# Patient Record
Sex: Male | Born: 1980 | Race: Black or African American | Hispanic: No | Marital: Married | Smoking: Never smoker
Health system: Southern US, Community
[De-identification: ages and names within clinical notes are randomized; demographics above are authoritative.]

## PROBLEM LIST (undated history)

## (undated) HISTORY — PX: KNEE ARTHROSCOPY: SHX127

---

## 2017-05-25 ENCOUNTER — Emergency Department (HOSPITAL_BASED_OUTPATIENT_CLINIC_OR_DEPARTMENT_OTHER)
Admission: EM | Admit: 2017-05-25 | Discharge: 2017-05-25 | Disposition: A | Discharge status: Home / Self Care | Payer: Self-pay | Attending: Emergency Medicine | Admitting: Emergency Medicine

## 2017-05-25 ENCOUNTER — Emergency Department (HOSPITAL_BASED_OUTPATIENT_CLINIC_OR_DEPARTMENT_OTHER): Payer: Self-pay

## 2017-05-25 ENCOUNTER — Encounter (HOSPITAL_BASED_OUTPATIENT_CLINIC_OR_DEPARTMENT_OTHER): Payer: Self-pay | Admitting: *Deleted

## 2017-05-25 DIAGNOSIS — Y9289 Other specified places as the place of occurrence of the external cause: Secondary | ICD-10-CM | POA: Insufficient documentation

## 2017-05-25 DIAGNOSIS — S61112A Laceration without foreign body of left thumb with damage to nail, initial encounter: Secondary | ICD-10-CM | POA: Insufficient documentation

## 2017-05-25 DIAGNOSIS — W260XXA Contact with knife, initial encounter: Secondary | ICD-10-CM | POA: Insufficient documentation

## 2017-05-25 DIAGNOSIS — Y999 Unspecified external cause status: Secondary | ICD-10-CM | POA: Insufficient documentation

## 2017-05-25 DIAGNOSIS — Y9389 Activity, other specified: Secondary | ICD-10-CM | POA: Insufficient documentation

## 2017-05-25 NOTE — ED Notes (Signed)
Patient transported to X-ray 

## 2017-05-25 NOTE — ED Provider Notes (Signed)
[facility]   CSN: [id number] Arrival date & time: 05/25/17  2005     History   Chief Complaint Chief Complaint  Patient presents with  . Laceration    HPI Javier Anderson is a 36 y.o. male.  HPI  This is a 36 year old male who presents with a laceration to the left thumb. Patient reports that he was cutting that stools when he cut the tip of his left thumb off. Unknown last tetanus shot. Pain is throbbing and worse when wound is manipulated. Denies other injury. Current pain is 4 out of 10.  History reviewed. No pertinent past medical history.  There are no active problems to display for this patient.   Past Surgical History:  Procedure Laterality Date  . KNEE ARTHROSCOPY         Home Medications    Prior to Admission medications   Not on File    Family History History reviewed. No pertinent family history.  Social History Social History  Substance Use Topics  . Smoking status: Never Smoker  . Smokeless tobacco: Not on file  . Alcohol use No     Allergies   Penicillins   Review of Systems Review of Systems  Skin: Positive for wound.  Neurological: Negative for weakness and numbness.  All other systems reviewed and are negative.    Physical Exam Updated Vital Signs BP (!) 149/82   Pulse 95   Temp 98.3 F (36.8 C) (Oral)   Resp 16   Ht 5\' 8"  (1.727 m)   Wt 104.3 kg (230 lb)   SpO2 99%   BMI 34.97 kg/m   Physical Exam  Constitutional: He is oriented to person, place, and time. He appears well-developed and well-nourished. No distress.  HENT:  Head: Normocephalic and atraumatic.  Cardiovascular: Normal rate and regular rhythm.   Pulmonary/Chest: Effort normal. No respiratory distress.  Musculoskeletal:  Dime-sized defect noted of the distal left thumb involving the medial nailbed of the thumb, brisk bleeding noted, no obvious exposure of bone  Neurological: He is alert and oriented to person, place, and time.   Skin: Skin is warm and dry.  Psychiatric: He has a normal mood and affect.  Nursing note and vitals reviewed.    ED Treatments / Results  Labs (all labs ordered are listed, but only abnormal results are displayed) Labs Reviewed - No data to display  EKG  EKG Interpretation None       Radiology Dg Finger Thumb Left  Result Date: 05/25/2017 CLINICAL DATA:  Laceration to the left thumb EXAM: LEFT THUMB 2+V COMPARISON:  None. FINDINGS: Overlying bandage material limits the study. No definitive fracture is seen. No subluxation. No radiopaque foreign body IMPRESSION: No definite acute osseous abnormality allowing for bandaging artifact Electronically Signed   By: Jasmine PangKim  Fujinaga M.D.   On: 05/25/2017 20:34    Procedures Wound repair Date/Time: 05/26/2017 5:15 AM Performed by: Shon BatonHORTON, Niv Darley F Authorized by: Shon BatonHORTON, Johara Lodwick F  Consent: Verbal consent obtained. Consent given by: patient Local anesthesia used: no  Anesthesia: Local anesthesia used: no  Sedation: Patient sedated: no Patient tolerance: Patient tolerated the procedure well with no immediate complications Comments: Wound was examined with finger tourniquet in place. Clean with saline. Wound seal applied to achieve hemostasis. Pressure dressing applied. Finger placed in a splint for protection.    (including critical care time)  Medications Ordered in ED Medications - No data to display   Initial Impression / Assessment and Plan /  ED Course  I have reviewed the triage vital signs and the nursing notes.  Pertinent labs & imaging results that were available during my care of the patient were reviewed by me and considered in my medical decision making (see chart for details).     Patient presents with injury to the left palm. Wound not amenable to suturing. Doesn't involve the nail bed. Pressure dressing was applied with thrombin for hemostasis. Splint was applied for protection. Recommended to patient that he  keep dressing in place for 2 days. At that time he needs to be reevaluated by primary physician or return for wound check and reapplication of dressing. No indication for an aquatics at this time. Tetanus was updated.  Final Clinical Impressions(s) / ED Diagnoses   Final diagnoses:  Laceration of left thumb without foreign body with damage to nail, initial encounter    New Prescriptions New Prescriptions   No medications on file     Shon Baton, MD 05/26/17 [id number]

## 2017-05-25 NOTE — ED Triage Notes (Signed)
Pt c/o lac to left thumb bleeding noted in triage.

## 2017-05-25 NOTE — Discharge Instructions (Signed)
You were seen today for an avulsion laceration of the thumb.  The nail bed is appear involved. Keep the dressing on for 2 days. Have it reevaluated in 2 days. You may come here or get your primary physician.

## 2018-11-23 IMAGING — CR DG FINGER THUMB 2+V*L*
3 series · 3 of 3 positions shown · non-contrast
Comparison: None.

CLINICAL DATA: Laceration to the left thumb

EXAM:
LEFT THUMB 2+V

[x finger obl. left]
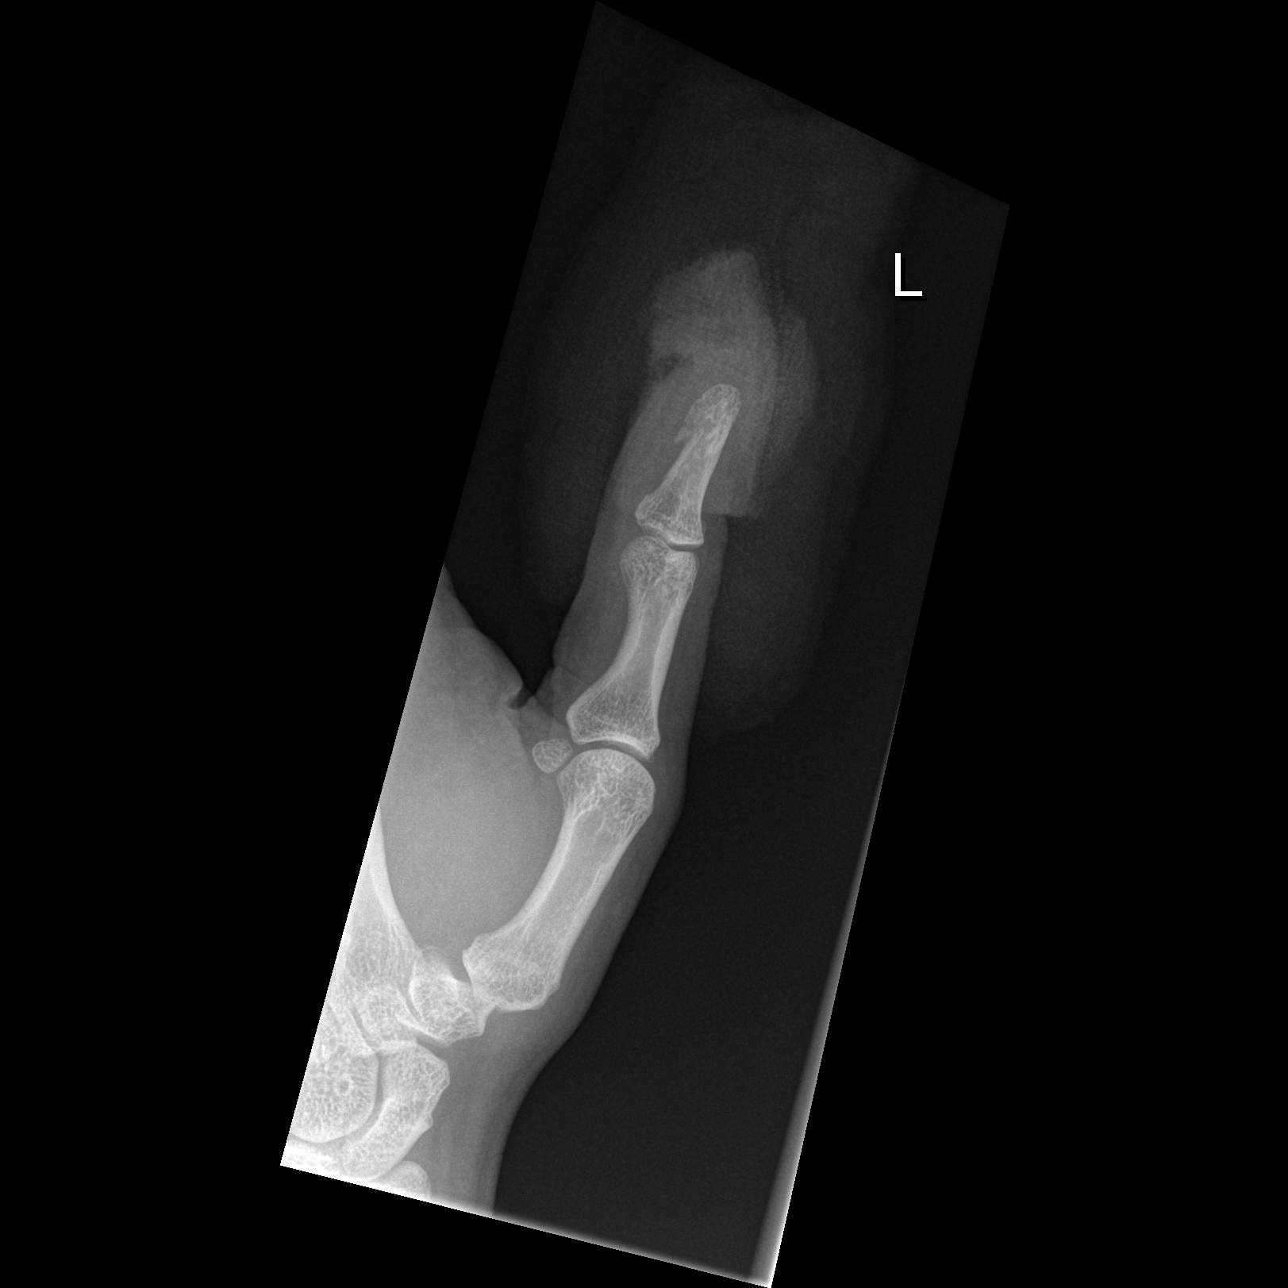

[x finger lateral left]
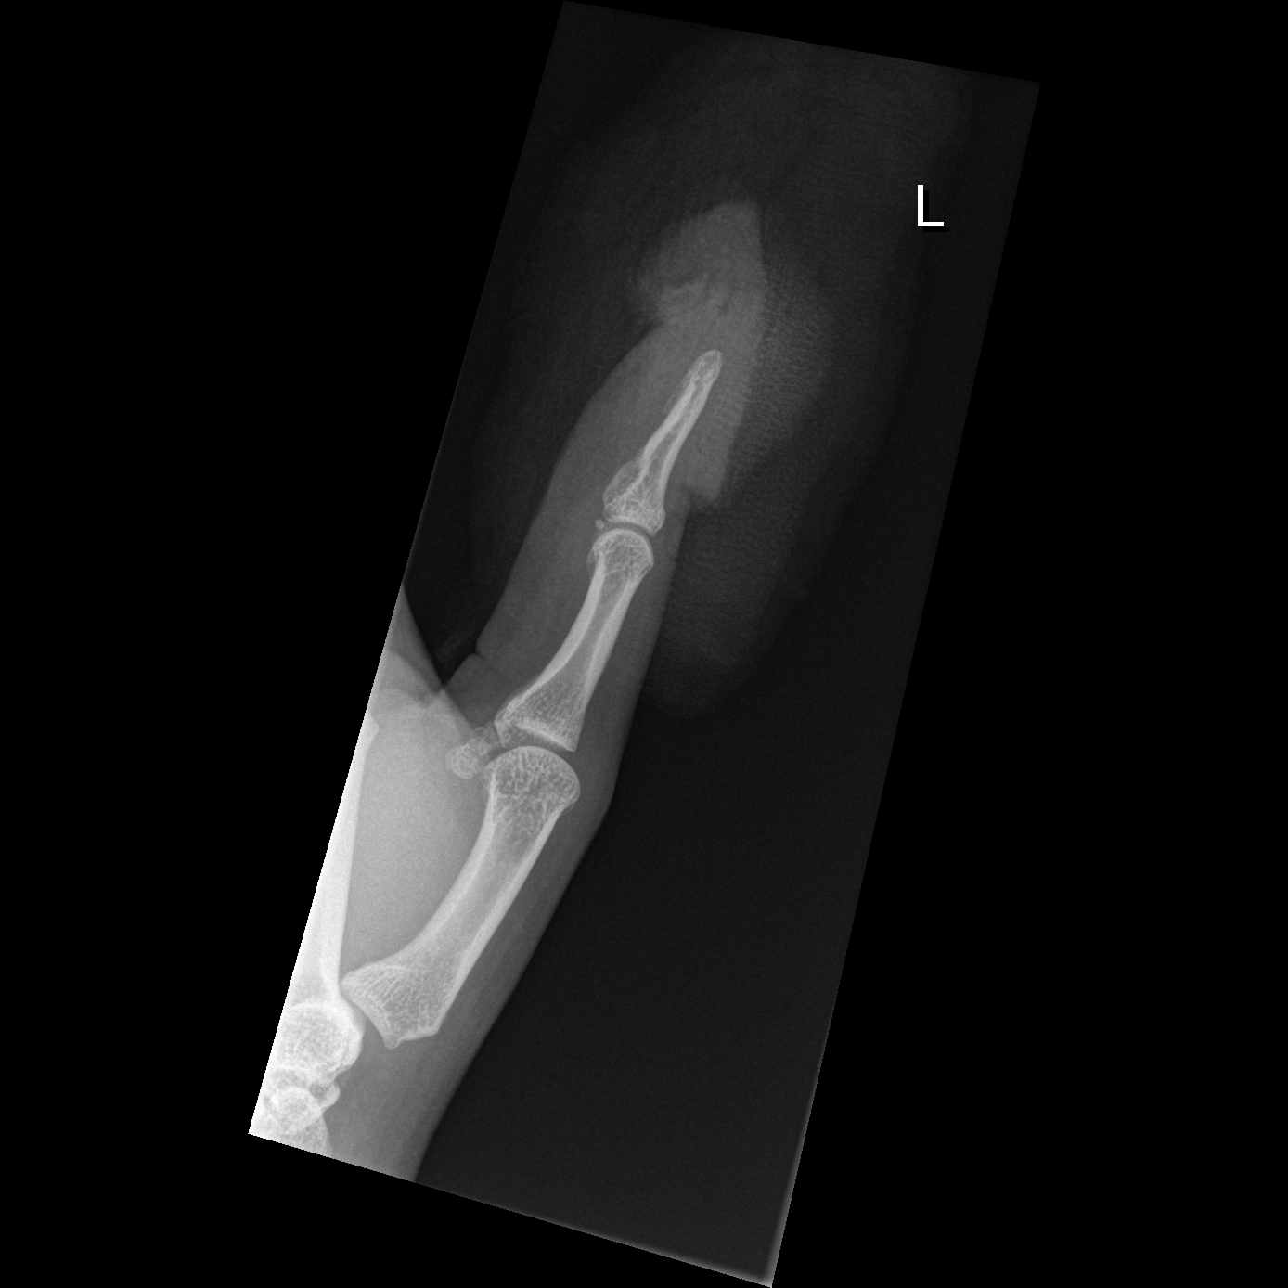

[x finger pa left]
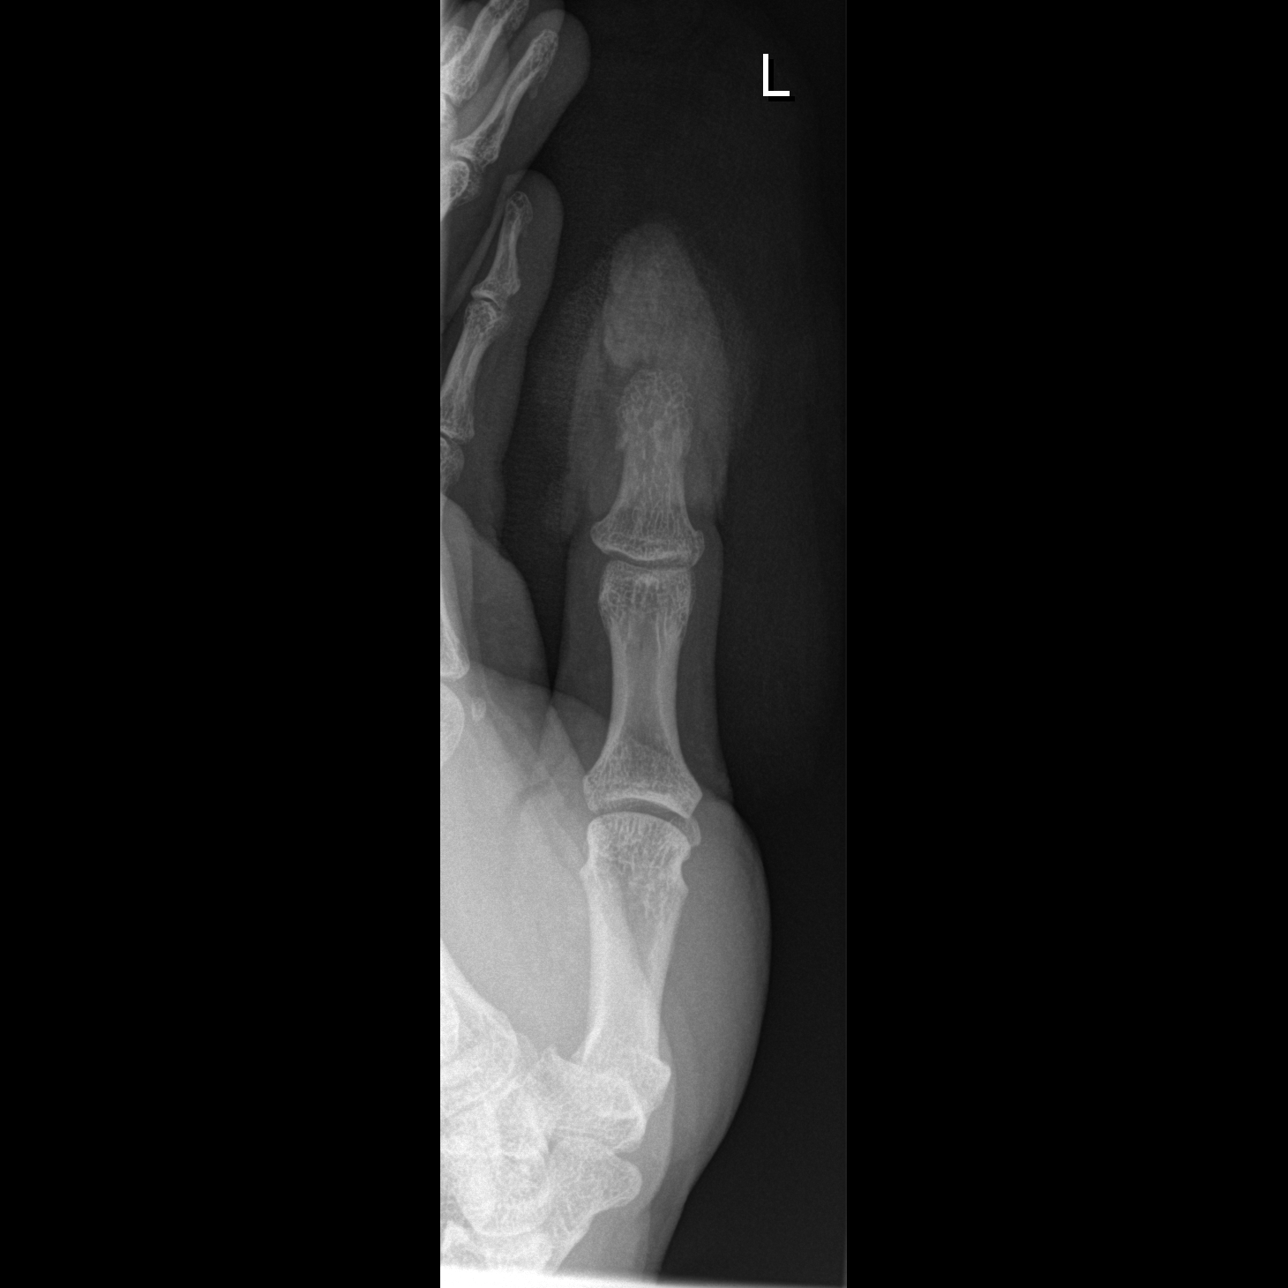

[3 of 3 positions shown; findings below may reference images not displayed]

FINDINGS: Overlying bandage material limits the study. No definitive fracture
is seen. No subluxation. No radiopaque foreign body
IMPRESSION: No definite acute osseous abnormality allowing for bandaging
artifact
